# Patient Record
Sex: Male | Born: 1970 | Race: White | Hispanic: No | Marital: Married | Smoking: Former smoker
Health system: Southern US, Community
[De-identification: ages and names within clinical notes are randomized; demographics above are authoritative.]

## PROBLEM LIST (undated history)

## (undated) DIAGNOSIS — R109 Unspecified abdominal pain: Secondary | ICD-10-CM

## (undated) DIAGNOSIS — E785 Hyperlipidemia, unspecified: Secondary | ICD-10-CM

## (undated) HISTORY — DX: Hyperlipidemia, unspecified: E78.5

## (undated) HISTORY — PX: CORONARY ANGIOPLASTY: SHX604

---

## 2001-05-19 ENCOUNTER — Emergency Department (HOSPITAL_COMMUNITY): Admission: EM | Admit: 2001-05-19 | Discharge: 2001-05-19 | Payer: Self-pay | Admitting: Emergency Medicine

## 2001-05-19 ENCOUNTER — Encounter: Payer: Self-pay | Admitting: Emergency Medicine

## 2001-09-24 ENCOUNTER — Encounter: Payer: Self-pay | Admitting: Emergency Medicine

## 2001-09-24 ENCOUNTER — Inpatient Hospital Stay (HOSPITAL_COMMUNITY): Admission: EM | Admit: 2001-09-24 | Discharge: 2001-09-26 | Payer: Self-pay | Admitting: Emergency Medicine

## 2001-09-27 ENCOUNTER — Emergency Department (HOSPITAL_COMMUNITY): Admission: EM | Admit: 2001-09-27 | Discharge: 2001-09-27 | Payer: Self-pay | Admitting: *Deleted

## 2003-07-01 ENCOUNTER — Ambulatory Visit (HOSPITAL_COMMUNITY): Admission: RE | Admit: 2003-07-01 | Discharge: 2003-07-01 | Payer: Self-pay | Admitting: Family Medicine

## 2003-09-17 ENCOUNTER — Ambulatory Visit (HOSPITAL_COMMUNITY): Admission: RE | Admit: 2003-09-17 | Discharge: 2003-09-17 | Payer: Self-pay | Admitting: Family Medicine

## 2004-12-03 ENCOUNTER — Emergency Department (HOSPITAL_COMMUNITY): Admission: EM | Admit: 2004-12-03 | Discharge: 2004-12-04 | Payer: Self-pay | Admitting: Emergency Medicine

## 2005-12-27 ENCOUNTER — Emergency Department (HOSPITAL_COMMUNITY): Admission: EM | Admit: 2005-12-27 | Discharge: 2005-12-27 | Payer: Self-pay | Admitting: Emergency Medicine

## 2007-06-02 ENCOUNTER — Emergency Department (HOSPITAL_COMMUNITY): Admission: EM | Admit: 2007-06-02 | Discharge: 2007-06-02 | Payer: Self-pay | Admitting: Emergency Medicine

## 2007-07-16 ENCOUNTER — Inpatient Hospital Stay (HOSPITAL_BASED_OUTPATIENT_CLINIC_OR_DEPARTMENT_OTHER): Admission: RE | Admit: 2007-07-16 | Discharge: 2007-07-16 | Payer: Self-pay | Admitting: Cardiology

## 2008-12-08 ENCOUNTER — Ambulatory Visit (HOSPITAL_COMMUNITY): Admission: RE | Admit: 2008-12-08 | Discharge: 2008-12-08 | Payer: Self-pay | Admitting: Psychiatry

## 2010-06-19 NOTE — Cardiovascular Report (Signed)
Charles Villanueva, Charles Villanueva                 ACCOUNT NO.:  [id number]   MEDICAL RECORD NO.:  [id number]          PATIENT TYPE:  OIB   LOCATION:  1962                         FACILITY:  [facility]   PHYSICIAN:  Armanda Magic, M.D.     DATE OF BIRTH:  May 05, 1970   DATE OF PROCEDURE:  07/15/2007  DATE OF DISCHARGE:                            CARDIAC CATHETERIZATION   REFERRING PHYSICIAN:  [name], MD   PROCEDURE:  1. Left heart catheterization.  2. Coronary angiography.  3. Left ventriculography.   OPERATOR:  Armanda Magic, MD   INDICATIONS:  Abnormal exercise treadmill test and dyslipidemia as well  as some occasional shortness of breath.   This is a very pleasant 40 year old male who presented to the emergency  room recently with some shortness of breath.  His cardiac workup was  negative, but he was found to have a triglycerides elevated at 888 and  he has been on Lovaza.  Because of his hyperlipidemia and some shortness  of breath, he was referred for cardiac evaluation.  The patient had an  exercise treadmill test, which showed EKG changes early on and now  scheduled for cardiac catheterization.   The patient was brought to the cardiac catheterization laboratory in a  fasting nonsedated state.  An informed consent was obtained.  The  patient was connected to continuous heart rate, pulse oximetry  monitoring, and blood pressure monitoring.  The right groin was prepped  and draped in sterile fashion.  A 1% Xylocaine was used for local  anesthesia.  Using modified Seldinger technique, a 4-French sheath was  placed in the right femoral artery.  Under fluoroscopic guidance, a 4-  [place] JL4 catheter was placed in left coronary artery.  Multiple cine  films were taken at 30-degree RAO, 40-degree LAO views.  His catheter  was then exchanged out over a guidewire for a 4-French 3-D RCA catheter  which was placed in the right coronary artery.  Multiple cine films  taken at 30-degree RAO and  40-degree LAO views.  The catheter was then  exchanged out over a guidewire for a 6 through a 4-French angled pigtail  catheter, which was placed under fluoroscopic guidance in the left  ventricular cavity.  A left ventriculography was performed in the 30-  degree RAO view using total of 30 mL of contrast at 15 mL per second.  Catheter was then pulled back across the aortic valve with no  significant gradient noted.  At the end of procedure, all catheters and  sheaths were removed.  Manual compression was performed till adequate  hemostasis was obtained.  The patient transferred back to room in stable  condition.   RESULTS:  The left main coronary artery is widely patent and trifurcates  into the left anterior descending artery, ramus branch, and left  circumflex artery.   The left anterior descending artery is widely patent throughout its  course.  The apex giving rise to a large first diagonal branch, which  has a proximal 60-75% narrowing.  It bifurcates into 2 daughter  branches.  The ramus branch is widely patent.  The left circumflex is widely patent throughout its course to the apex.  It gives rise to 2 obtuse marginal branches, but are both widely patent  and terminates in a posterior descending artery, which is widely patent  in his left dominant system.   The right coronary artery is widely patent and gives off an acute RV  marginal branch and then terminates in the posterior lateral branch, all  of which are widely patent.   The left ventriculography shows normal LV function with EF of 60%, LV  pressure 123/30 mmHg, aortic pressure 123/70 mmHg, and LVEDP 15 mmHg.   ASSESSMENT:  1. 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  2. Normal left ventricular function.  3. Hypertriglyceridemia.   PLAN:  Discharge to home and bedrest complete.  Start aspirin 325 mg a  day.  Aggressive lipid management and will follow up in [facility].  He will follow up  with my nurse practitioner in 2 weeks for groin check  and I will review the films with Dr. Eldridge Dace in regards to medical  therapy versus PCI of the diagonal.      Armanda Magic, M.D.  Electronically Signed     TT/MEDQ  D:  07/16/2007  T:  07/16/2007  Job:  [id number]   cc:   [name], M.D.

## 2010-06-22 NOTE — H&P (Signed)
   NAME:  Villanueva, Charles A                           ACCOUNT NO.:  [id number]   MEDICAL RECORD NO.:  [id number]                   PATIENT TYPE:  INP   LOCATION:  5725                                 FACILITY:  [facility]   PHYSICIAN:  Verl Dicker, M.D.          DATE OF BIRTH:  1970/11/23   DATE OF ADMISSION:  09/24/2001  DATE OF DISCHARGE:                                HISTORY & PHYSICAL   HISTORY OF PRESENT ILLNESS:  A 40 year old male with a 2 mm left UVJ stone.  The patient has intractable pain.   Positive family history for renal stones.  The patient's father, Rollin Kotowski, has been a patient of Dr. Aldean Ast for many years.   In April of 2003, he was seen at [facility] emergency room where  CAT scan showed nonobstructive 2 mm stone within the left kidney and a 2 mm  stone at the left UVJ.  The patient subsequently passed the UVJ stone on his  own. Follow-up in the office showed that microscopic hematuria had resolved  and hypercalcuria profile showed no abnormality.   On September 24, 2001, at 6 a.m. he had intense left CVA tenderness with  nausea, but no vomiting or fever.  Repeat CT scan of the abdomen, spiral CT  without contrast, showed the previous left renal calculus had disappeared  and was now at the left UVJ, approximately 2 mm.  The patient has had no  relief despite IM Toradol and p.r.n. Dilaudid.  He is admitted for PCA  Dilaudid with q.8h. Toradol.   MEDICATIONS:  Ibuprofen.   ALLERGIES:  PENICILLIN, ASPIRIN.   Tobacco; half a pack per day over 12 years.  EtOH; denies.   PAST MEDICAL HISTORY:  Significant for spontaneous passage of ureteral  calculus in April of 2003.   PHYSICAL EXAMINATION:  GENERAL: A 40 year old white male with intractable  left CVA tenderness.  VITAL SIGNS:  Temperature 97.9, blood pressure 156/88, pulse 75,  respirations 22.  HEENT:  Negative adenopathy and negative bruit.  LUNGS:  Clear to P&A.  HEART: Regular rate and  rhythm without murmur or gallop.  ABDOMEN:  Positive bowel sounds, soft.  GENITOURINARY:  Intense left CVA tenderness with radiation to the left lower  quadrant. Bilaterally descended testes.  Penis without lesions.  No obvious  evidence of hernia.  Nontender, non-nodular prostate.  NEUROLOGY:  Grossly intact.   PLAN:  IV fluids, IV Toradol, and PCA Dilaudid.  Will follow clinical  examination closely.                                              Verl Dicker, M.D.   [facility]/MEDQ  D:  09/24/2001  T:  09/26/2001  Job:  [id number]

## 2010-10-30 LAB — DIFFERENTIAL
Basophils Absolute: 0.1
Eosinophils Relative: 1
Lymphocytes Relative: 20
Monocytes Relative: 6
Neutro Abs: 7.4
Neutrophils Relative %: 73

## 2010-10-30 LAB — POCT CARDIAC MARKERS
CKMB, poc: <1 — ABNORMAL LOW
CKMB, poc: <1 — ABNORMAL LOW
Myoglobin, poc: 94.3
Operator id: 146091
Operator id: 234501
Troponin i, poc: <0.05
Troponin i, poc: <0.05

## 2010-10-30 LAB — CBC
HCT: 45.8
MCHC: 34.3
Platelets: 141 — ABNORMAL LOW
RBC: 5.23

## 2010-10-30 LAB — POCT I-STAT, CHEM 8
Creatinine, Ser: 1
Hemoglobin: 16
Sodium: 140

## 2012-06-11 ENCOUNTER — Other Ambulatory Visit: Payer: Self-pay | Admitting: Physician Assistant

## 2012-06-11 DIAGNOSIS — R1013 Epigastric pain: Secondary | ICD-10-CM

## 2012-06-16 ENCOUNTER — Ambulatory Visit
Admission: RE | Admit: 2012-06-16 | Discharge: 2012-06-16 | Disposition: A | Discharge status: Home / Self Care | Payer: BC Managed Care – PPO | Source: Ambulatory Visit | Attending: Physician Assistant | Admitting: Physician Assistant

## 2012-06-16 DIAGNOSIS — R1013 Epigastric pain: Secondary | ICD-10-CM

## 2012-06-19 ENCOUNTER — Other Ambulatory Visit (HOSPITAL_COMMUNITY): Payer: Self-pay | Admitting: Gastroenterology

## 2012-06-19 DIAGNOSIS — R109 Unspecified abdominal pain: Secondary | ICD-10-CM

## 2012-07-14 ENCOUNTER — Encounter (HOSPITAL_COMMUNITY)
Admission: RE | Admit: 2012-07-14 | Discharge: 2012-07-14 | Disposition: A | Discharge status: Home / Self Care | Payer: BC Managed Care – PPO | Source: Ambulatory Visit | Attending: Gastroenterology | Admitting: Gastroenterology

## 2012-07-14 ENCOUNTER — Encounter (HOSPITAL_COMMUNITY): Payer: Self-pay

## 2012-07-14 DIAGNOSIS — R1011 Right upper quadrant pain: Secondary | ICD-10-CM | POA: Insufficient documentation

## 2012-07-14 DIAGNOSIS — R109 Unspecified abdominal pain: Secondary | ICD-10-CM

## 2012-07-14 HISTORY — DX: Unspecified abdominal pain: R10.9

## 2012-07-14 MED ORDER — TECHNETIUM TC 99M MEBROFENIN IV KIT
5.3000 | PACK | Freq: Once | INTRAVENOUS | Status: AC | PRN
  Administered 2012-07-14: 5.3 via INTRAVENOUS

## 2013-03-13 ENCOUNTER — Encounter: Payer: Self-pay | Admitting: *Deleted

## 2013-03-13 ENCOUNTER — Other Ambulatory Visit: Payer: Self-pay | Admitting: *Deleted

## 2013-03-13 DIAGNOSIS — I25810 Atherosclerosis of coronary artery bypass graft(s) without angina pectoris: Secondary | ICD-10-CM

## 2013-03-13 DIAGNOSIS — E7800 Pure hypercholesterolemia, unspecified: Secondary | ICD-10-CM

## 2013-03-13 HISTORY — DX: Pure hypercholesterolemia, unspecified: E78.00

## 2013-03-13 HISTORY — DX: Atherosclerosis of coronary artery bypass graft(s) without angina pectoris: I25.810

## 2014-01-12 IMAGING — NM NM HEPATO W/GB/PHARM/[PERSON_NAME]
2 series · 12 of 12 positions shown · non-contrast
Comparison: none

CLINICAL DATA: Right upper abdominal pain.  Negative gallbladder
ultrasound.

[Series 1: gb hepatobiliary scan · 4.75mm/px · 6 of 60 frames shown (1 of 2)]
[frame 6/60]
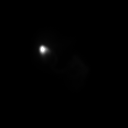
[frame 16/60]
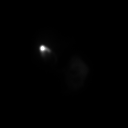
[frame 26/60]
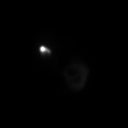
[frame 36/60]
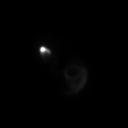
[frame 46/60]
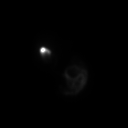
[frame 56/60]
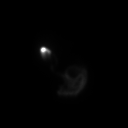

[Series 1: gb hepatobiliary scan · 4.75mm/px · 6 of 60 frames shown (2 of 2)]
[frame 6/60]
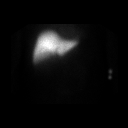
[frame 16/60]
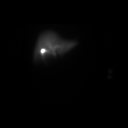
[frame 26/60]
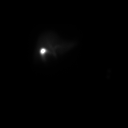
[frame 36/60]
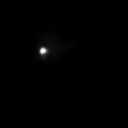
[frame 46/60]
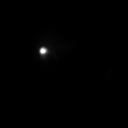
[frame 56/60]
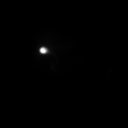

[12 of 12 positions shown; findings below may reference images not displayed]

HEPATOBILIARY SCINTIGRAPHY WITH EJECTION FRACTION

Anterior imaging afterH.4m8i TcQQP Choletec IV. There is prompt
clearance of the radiopharmaceutical from the blood pool. Timely
visualization of activity in central bile ducts, small bowel, and
gallbladder.
After 1 hour,the patient ingested 8 ounces Ensure orally, and
imaging continued. No symptoms reported after ingestion. The
calculated gallbladder ejection fraction over 30 minutes is 41.8%
[(normal >30% at  thirty minutes (Ziessman et al., 3883 J. Nuclear
Med).]

IMPRESSION
1. Patency of cystic and common bile ducts.
2. Normal gallbladder ejection fraction.

## 2014-07-27 ENCOUNTER — Encounter (HOSPITAL_COMMUNITY): Payer: Self-pay | Admitting: *Deleted

## 2014-07-27 ENCOUNTER — Emergency Department (HOSPITAL_COMMUNITY)
Admission: EM | Admit: 2014-07-27 | Discharge: 2014-07-27 | Disposition: A | Discharge status: Home / Self Care | Payer: BLUE CROSS/BLUE SHIELD | Attending: Emergency Medicine | Admitting: Emergency Medicine

## 2014-07-27 DIAGNOSIS — M79662 Pain in left lower leg: Secondary | ICD-10-CM | POA: Diagnosis not present

## 2014-07-27 DIAGNOSIS — M79651 Pain in right thigh: Secondary | ICD-10-CM | POA: Diagnosis not present

## 2014-07-27 DIAGNOSIS — Z8639 Personal history of other endocrine, nutritional and metabolic disease: Secondary | ICD-10-CM | POA: Insufficient documentation

## 2014-07-27 DIAGNOSIS — M79661 Pain in right lower leg: Secondary | ICD-10-CM | POA: Insufficient documentation

## 2014-07-27 DIAGNOSIS — Z87891 Personal history of nicotine dependence: Secondary | ICD-10-CM | POA: Diagnosis not present

## 2014-07-27 DIAGNOSIS — M79652 Pain in left thigh: Secondary | ICD-10-CM | POA: Diagnosis not present

## 2014-07-27 DIAGNOSIS — G8929 Other chronic pain: Secondary | ICD-10-CM | POA: Diagnosis not present

## 2014-07-27 DIAGNOSIS — R51 Headache: Secondary | ICD-10-CM | POA: Diagnosis not present

## 2014-07-27 DIAGNOSIS — M79606 Pain in leg, unspecified: Secondary | ICD-10-CM

## 2014-07-27 DIAGNOSIS — F419 Anxiety disorder, unspecified: Secondary | ICD-10-CM | POA: Insufficient documentation

## 2014-07-27 DIAGNOSIS — Z880 Allergy status to penicillin: Secondary | ICD-10-CM | POA: Insufficient documentation

## 2014-07-27 DIAGNOSIS — M629 Disorder of muscle, unspecified: Secondary | ICD-10-CM

## 2014-07-27 DIAGNOSIS — M62838 Other muscle spasm: Secondary | ICD-10-CM | POA: Diagnosis present

## 2014-07-27 LAB — CBC WITH DIFFERENTIAL/PLATELET
Basophils Absolute: 0 10*3/uL (ref 0.0–0.1)
Basophils Relative: 0 % (ref 0–1)
EOS ABS: 0 10*3/uL (ref 0.0–0.7)
EOS PCT: 0 % (ref 0–5)
HCT: 44.5 % (ref 39.0–52.0)
Hemoglobin: 16 g/dL (ref 13.0–17.0)
LYMPHS ABS: 1.3 10*3/uL (ref 0.7–4.0)
Lymphocytes Relative: 14 % (ref 12–46)
MCH: 29.9 pg (ref 26.0–34.0)
MCHC: 36 g/dL (ref 30.0–36.0)
MCV: 83.2 fL (ref 78.0–100.0)
MONO ABS: 0.7 10*3/uL (ref 0.1–1.0)
Monocytes Relative: 7 % (ref 3–12)
NEUTROS PCT: 79 % — AB (ref 43–77)
Neutro Abs: 7.8 10*3/uL — ABNORMAL HIGH (ref 1.7–7.7)
PLATELETS: 128 10*3/uL — AB (ref 150–400)
RBC: 5.35 MIL/uL (ref 4.22–5.81)
RDW: 12.8 % (ref 11.5–15.5)
WBC: 9.8 10*3/uL (ref 4.0–10.5)

## 2014-07-27 LAB — COMPREHENSIVE METABOLIC PANEL
ALT: 24 U/L (ref 17–63)
ANION GAP: 9 (ref 5–15)
AST: 26 U/L (ref 15–41)
Albumin: 4.1 g/dL (ref 3.5–5.0)
Alkaline Phosphatase: 57 U/L (ref 38–126)
BILIRUBIN TOTAL: 0.6 mg/dL (ref 0.3–1.2)
BUN: 14 mg/dL (ref 6–20)
CALCIUM: 8.8 mg/dL — AB (ref 8.9–10.3)
CHLORIDE: 100 mmol/L — AB (ref 101–111)
CO2: 25 mmol/L (ref 22–32)
CREATININE: 1.12 mg/dL (ref 0.61–1.24)
Glucose, Bld: 93 mg/dL (ref 65–99)
Potassium: 4.1 mmol/L (ref 3.5–5.1)
Sodium: 134 mmol/L — ABNORMAL LOW (ref 135–145)
Total Protein: 7 g/dL (ref 6.5–8.1)

## 2014-07-27 MED ORDER — CYCLOBENZAPRINE HCL 10 MG PO TABS: 10.0000 mg | ORAL_TABLET | Freq: Three times a day (TID) | ORAL | Status: DC | PRN

## 2014-07-27 NOTE — ED Notes (Signed)
Pt c/o bilateral muscle spasms in lower legs for a week. Pt denies pain with the muscle spasms until today. Pt states pain shot up his groin and now also c/o HA. Pt states he has severe anxiety. Pt reports eating and drinking ok. 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 Pt denies redness or swelling in legs.

## 2014-07-27 NOTE — ED Provider Notes (Signed)
CSN: [id number]     Arrival date & time 07/27/14  1953 History   First MD Initiated Contact with Patient 07/27/14 2042     Chief Complaint  Patient presents with  . Spasms     (Consider location/radiation/quality/duration/timing/severity/associated sxs/prior Treatment) HPI Comments: Charles Villanueva is a 44 y.o. male with a PMHx of anxiety, chronic abd pain, and HLD, who presents to the ED with complaints of bilateral calf and hamstring spasms 10 days. He reports that initially they were not painful, but they've become more painful. Describes the pain is 9/10 dull and aching and occasionally burning, intermittent only with spasms, located in both hamstrings and calves, nonradiating, worse with prolonged sitting and flexing of his muscles, and with no treatments tried prior to arrival. He reports a sedentary job which aggravates his symptoms. Additionally reports he is developing a migraine which started gradually approximately one hour ago and is the same as his prior headaches. He denies any erythema to his legs, warmth, leg swelling, recent trauma or injury, recent travel or prolonged immobilization, recent surgery, fevers, chills, chest pain, shortness breath, cough, hemoptysis, wheezing, leg swelling, abdominal pain, nausea, vomiting, diarrhea, constipation, melanotic, hematochezia, dysuria, hematuria, numbness, tingling, weakness, lightheadedness, dizziness, or vision changes.   Patient is a 44 y.o. male presenting with musculoskeletal pain. The history is provided by the patient. No language interpreter was used.  Muscle Pain This is a new problem. The current episode started 1 to 4 weeks ago. The problem occurs intermittently. The problem has been unchanged. Associated symptoms include headaches and myalgias (b/l hamstrings and calves). Pertinent negatives include no abdominal pain, arthralgias, chest pain, chills, coughing, fever, joint swelling, nausea, numbness, urinary symptoms, visual change,  vomiting or weakness. Exacerbated by: sitting. He has tried nothing for the symptoms. The treatment provided no relief.    Past Medical History  Diagnosis Date  . Abdominal pain   . Dyslipidemia    Past Surgical History  Procedure Laterality Date  . Coronary angioplasty     Family History  Problem Relation Age of Onset  . Hypertension Mother   . Hypertension Father    History  Substance Use Topics  . Smoking status: Former Smoker    Quit date: 03/13/2010  . Smokeless tobacco: Not on file  . Alcohol Use: No    Review of Systems  Constitutional: Negative for fever and chills.  Eyes: Negative for visual disturbance.  Respiratory: Negative for cough, shortness of breath and wheezing.   Cardiovascular: Negative for chest pain and leg swelling.  Gastrointestinal: Negative for nausea, vomiting, abdominal pain, diarrhea, constipation and blood in stool.  Genitourinary: Negative for dysuria and hematuria.  Musculoskeletal: Positive for myalgias (b/l hamstrings and calves). Negative for joint swelling and arthralgias.  Skin: Negative for color change.  Allergic/Immunologic: Negative for immunocompromised state.  Neurological: Positive for headaches. Negative for dizziness, weakness, light-headedness and numbness.  Psychiatric/Behavioral: Negative for confusion.   10 Systems reviewed and are negative for acute change except as noted in the HPI.    Allergies  Gluten meal and Penicillins  Home Medications   Prior to Admission medications   Medication Sig Start Date End Date Taking? Authorizing Provider  ALPRAZolam Prudy Feeler) 0.5 MG tablet Take 0.5 mg by mouth 2 (two) times daily as needed for anxiety.   Yes Historical Provider, MD  DM-Phenylephrine-Acetaminophen (VICKS DAYQUIL MULTI-SYMPTOM) 10-5-325 MG CAPS Take 1 tablet by mouth daily as needed (pain).   Yes Historical Provider, MD   BP 140/91 mmHg  Pulse  87  Temp(Src) 98.9 F (37.2 C) (Oral)  Resp 16  SpO2 98% Physical  Exam  Constitutional: He is oriented to person, place, and time. Vital signs are normal. He appears well-developed and well-nourished.  Non-toxic appearance. No distress.  Afebrile, nontoxic, NAD  HENT:  Head: Normocephalic and atraumatic.  Mouth/Throat: Oropharynx is clear and moist and mucous membranes are normal.  Eyes: Conjunctivae and EOM are normal. Pupils are equal, round, and reactive to light. Right eye exhibits no discharge. Left eye exhibits no discharge.  PERRL, EOMI, no nystagmus, no visual field deficits   Neck: Normal range of motion. Neck supple.  Cardiovascular: Normal rate, regular rhythm, normal heart sounds and intact distal pulses.  Exam reveals no gallop and no friction rub.   No murmur heard. Pulmonary/Chest: Effort normal and breath sounds normal. No respiratory distress. He has no decreased breath sounds. He has no wheezes. He has no rhonchi. He has no rales.  Abdominal: Soft. Normal appearance and bowel sounds are normal. He exhibits no distension. There is no tenderness. There is no rigidity, no rebound, no guarding, no CVA tenderness, no tenderness at McBurney's point and negative Murphy's sign.  Musculoskeletal: Normal range of motion.       Right upper leg: He exhibits tenderness. He exhibits no bony tenderness and no edema.       Left upper leg: He exhibits tenderness. He exhibits no bony tenderness and no edema.       Right lower leg: He exhibits tenderness. He exhibits no bony tenderness and no swelling.       Left lower leg: He exhibits tenderness. He exhibits no bony tenderness and no swelling.  B/l hips and knees nonTTP with FROM intact, no swelling or erythema, no warmth. B/L posterior thigh/hamstrings and calves with diffuse tenderness, some tightness/spasm palpated in hamstrings bilaterally, no bony TTP. No pedal edema, neg Homan's bilaterally. MAE x4. Strength and sensation grossly intact. Distal pulses intact.   Neurological: He is alert and oriented to  person, place, and time. He has normal strength. No cranial nerve deficit or sensory deficit. Coordination and gait normal. GCS eye subscore is 4. GCS verbal subscore is 5. GCS motor subscore is 6.  CN 2-12 grossly intact A&O x4 GCS 15 Sensation and strength intact Gait nonataxic Coordination WNL  Skin: Skin is warm, dry and intact. No rash noted.  Psychiatric: He has a normal mood and affect.  Nursing note and vitals reviewed.   ED Course  Procedures (including critical care time) Labs Review Labs Reviewed  CBC WITH DIFFERENTIAL/PLATELET - Abnormal; Notable for the following:    Platelets 128 (*)    Neutrophils Relative % 79 (*)    Neutro Abs 7.8 (*)    All other components within normal limits  COMPREHENSIVE METABOLIC PANEL - Abnormal; Notable for the following:    Sodium 134 (*)    Chloride 100 (*)    Calcium 8.8 (*)    All other components within normal limits    Imaging Review No results found.   EKG Interpretation None      MDM   Final diagnoses:  Pain of lower extremity, unspecified laterality  Muscle spasms of both lower extremities  Hamstring tightness of both lower extremities    44 y.o. male here with b/l leg spasms intermittently. Also states he has a HA which is his typical headache and without red flag s/sx, nonfocal neuro exam. On exam, hamstrings very tight with diffuse muscular tenderness in both legs, no  swelling or erythema, no warmth, no homan's sign. Agreed that checking labs is reasonable, but doubt need for imaging, this is likely due to sedentary lifestyle and some tightening in hamstrings. Discussed importance of stretching and staying hydrated. Pt declined meds here. Will await labs.   9:39 PM Labs unremarkable. Discussed stretching, heat therapy, tylenol/motrin, and use of flexeril as directed/needed for spasm. Will have him f/up with PCP in 1wk. I explained the diagnosis and have given explicit precautions to return to the ER including for  any other new or worsening symptoms. The patient understands and accepts the medical plan as it's been dictated and I have answered their questions. Discharge instructions concerning home care and prescriptions have been given. The patient is STABLE and is discharged to home in good condition.  BP 140/91 mmHg  Pulse 87  Temp(Src) 98.9 F (37.2 C) (Oral)  Resp 16  SpO2 98%  Meds ordered this encounter  Medications  . cyclobenzaprine (FLEXERIL) 10 MG tablet    Sig: Take 1 tablet (10 mg total) by mouth 3 (three) times daily as needed for muscle spasms.    Dispense:  15 tablet    Refill:  0    Order Specific Question:  Supervising Provider    Answer:  Eber Hong [[id number]]     Khush Pasion Camprubi-Soms, PA-C 07/27/14 2140  Vanetta Mulders, MD 07/31/14 [phone]

## 2014-07-27 NOTE — Discharge Instructions (Signed)
Use heat to the areas of pain, 20 minutes at a time every hour. Stay well hydrated. Use tylenol or motrin as needed for pain. Stretch daily, and avoid prolonged sitting. Use flexeril as directed as needed for spasms, but don't drive while taking this medication. Follow up with your regular doctor in 1 week for recheck of symptoms. Return to the ER for changes or worsening symptoms.   Muscle Cramps and Spasms Muscle cramps and spasms are when muscles tighten by themselves. They usually get better within minutes. Muscle cramps are painful. They are usually stronger and last longer than muscle spasms. Muscle spasms may or may not be painful. They can last a few seconds or much longer. HOME CARE  Drink enough fluid to keep your pee (urine) clear or pale yellow.  Massage, stretch, and relax the muscle.  Use a warm towel, heating pad, or warm shower water on tight muscles.  Place ice on the muscle if it is tender or in pain.  Put ice in a plastic bag.  Place a towel between your skin and the bag.  Leave the ice on for 15-20 minutes, 03-04 times a day.  Only take medicine as told by your doctor. GET HELP RIGHT AWAY IF:  Your cramps or spasms get worse, happen more often, or do not get better with time. MAKE SURE YOU:  Understand these instructions.  Will watch your condition.  Will get help right away if you are not doing well or get worse. Document Released: 01/04/2008 Document Revised: 05/18/2012 Document Reviewed: 01/08/2012 [facility] Patient Information 2015 [place], [place]. This information is not intended to replace advice given to you by your health care provider. Make sure you discuss any questions you have with your health care provider.  Heat Therapy Heat therapy can help ease sore, stiff, injured, and tight muscles and joints. Heat relaxes your muscles, which may help ease your pain.  RISKS AND COMPLICATIONS If you have any of the following conditions, do not use heat therapy  unless your health care provider has approved:  Poor circulation.  Healing wounds or scarred skin in the area being treated.  Diabetes, heart disease, or high blood pressure.  Not being able to feel (numbness) the area being treated.  Unusual swelling of the area being treated.  Active infections.  Blood clots.  Cancer.  Inability to communicate pain. This may include young children and people who have problems with their brain function (dementia).  Pregnancy. Heat therapy should only be used on old, pre-existing, or long-lasting (chronic) injuries. Do not use heat therapy on new injuries unless directed by your health care provider. HOW TO USE HEAT THERAPY There are several different kinds of heat therapy, including:  Moist heat pack.  Warm water bath.  Hot water bottle.  Electric heating pad.  Heated gel pack.  Heated wrap.  Electric heating pad. Use the heat therapy method suggested by your health care provider. Follow your health care provider's instructions on when and how to use heat therapy. GENERAL HEAT THERAPY RECOMMENDATIONS  Do not sleep while using heat therapy. Only use heat therapy while you are awake.  Your skin may turn pink while using heat therapy. Do not use heat therapy if your skin turns red.  Do not use heat therapy if you have new pain.  High heat or long exposure to heat can cause burns. Be careful when using heat therapy to avoid burning your skin.  Do not use heat therapy on areas of your  skin that are already irritated, such as with a rash or sunburn. SEEK MEDICAL CARE IF:  You have blisters, redness, swelling, or numbness.  You have new pain.  Your pain is worse. MAKE SURE YOU:  Understand these instructions.  Will watch your condition.  Will get help right away if you are not doing well or get worse. Document Released: 04/15/2011 Document Revised: 06/07/2013 Document Reviewed: 03/16/2013 [facility] Patient Information 2015  [place], [place]. This information is not intended to replace advice given to you by your health care provider. Make sure you discuss any questions you have with your health care provider.

## 2016-07-29 ENCOUNTER — Ambulatory Visit: Payer: BLUE CROSS/BLUE SHIELD | Admitting: Podiatry

## 2018-11-17 ENCOUNTER — Emergency Department (HOSPITAL_COMMUNITY)
Admission: EM | Admit: 2018-11-17 | Discharge: 2018-11-17 | Disposition: A | Discharge status: Home / Self Care | Payer: Self-pay | Attending: Emergency Medicine | Admitting: Emergency Medicine

## 2018-11-17 ENCOUNTER — Emergency Department (HOSPITAL_COMMUNITY): Payer: Self-pay

## 2018-11-17 ENCOUNTER — Other Ambulatory Visit: Payer: Self-pay

## 2018-11-17 DIAGNOSIS — Z87891 Personal history of nicotine dependence: Secondary | ICD-10-CM | POA: Insufficient documentation

## 2018-11-17 DIAGNOSIS — Z951 Presence of aortocoronary bypass graft: Secondary | ICD-10-CM | POA: Insufficient documentation

## 2018-11-17 DIAGNOSIS — R109 Unspecified abdominal pain: Secondary | ICD-10-CM | POA: Insufficient documentation

## 2018-11-17 DIAGNOSIS — I2510 Atherosclerotic heart disease of native coronary artery without angina pectoris: Secondary | ICD-10-CM | POA: Insufficient documentation

## 2018-11-17 DIAGNOSIS — R0789 Other chest pain: Secondary | ICD-10-CM | POA: Insufficient documentation

## 2018-11-17 DIAGNOSIS — R079 Chest pain, unspecified: Secondary | ICD-10-CM

## 2018-11-17 DIAGNOSIS — R42 Dizziness and giddiness: Secondary | ICD-10-CM | POA: Insufficient documentation

## 2018-11-17 DIAGNOSIS — Z20828 Contact with and (suspected) exposure to other viral communicable diseases: Secondary | ICD-10-CM | POA: Insufficient documentation

## 2018-11-17 LAB — CBC
HCT: 49.1 % (ref 39.0–52.0)
Hemoglobin: 17.1 g/dL — ABNORMAL HIGH (ref 13.0–17.0)
MCH: 29.3 pg (ref 26.0–34.0)
MCHC: 34.8 g/dL (ref 30.0–36.0)
MCV: 84.2 fL (ref 80.0–100.0)
Platelets: 134 10*3/uL — ABNORMAL LOW (ref 150–400)
RBC: 5.83 MIL/uL — ABNORMAL HIGH (ref 4.22–5.81)
RDW: 12.6 % (ref 11.5–15.5)
WBC: 6.3 10*3/uL (ref 4.0–10.5)
nRBC: 0 % (ref 0.0–0.2)

## 2018-11-17 LAB — URINALYSIS, ROUTINE W REFLEX MICROSCOPIC
Bilirubin Urine: NEGATIVE
Glucose, UA: NEGATIVE mg/dL
Hgb urine dipstick: NEGATIVE
Ketones, ur: NEGATIVE mg/dL
Leukocytes,Ua: NEGATIVE
Nitrite: NEGATIVE
Protein, ur: NEGATIVE mg/dL
Specific Gravity, Urine: 1.006 (ref 1.005–1.030)
pH: 7 (ref 5.0–8.0)

## 2018-11-17 LAB — COMPREHENSIVE METABOLIC PANEL
ALT: 27 U/L (ref 0–44)
AST: 21 U/L (ref 15–41)
Albumin: 4 g/dL (ref 3.5–5.0)
Alkaline Phosphatase: 48 U/L (ref 38–126)
Anion gap: 11 (ref 5–15)
BUN: 12 mg/dL (ref 6–20)
CO2: 21 mmol/L — ABNORMAL LOW (ref 22–32)
Calcium: 9.4 mg/dL (ref 8.9–10.3)
Chloride: 104 mmol/L (ref 98–111)
Creatinine, Ser: 1.07 mg/dL (ref 0.61–1.24)
GFR calc Af Amer: >60 mL/min (ref >60)
GFR calc non Af Amer: >60 mL/min (ref >60)
Glucose, Bld: 106 mg/dL — ABNORMAL HIGH (ref 70–99)
Potassium: 4 mmol/L (ref 3.5–5.1)
Sodium: 136 mmol/L (ref 135–145)
Total Bilirubin: 1.1 mg/dL (ref 0.3–1.2)
Total Protein: 7 g/dL (ref 6.5–8.1)

## 2018-11-17 LAB — LIPASE, BLOOD: Lipase: 44 U/L (ref 11–51)

## 2018-11-17 LAB — TROPONIN I (HIGH SENSITIVITY)
Troponin I (High Sensitivity): 4 ng/L (ref <18)
Troponin I (High Sensitivity): 4 ng/L (ref <18)

## 2018-11-17 LAB — SARS CORONAVIRUS 2 (TAT 6-24 HRS): SARS Coronavirus 2: NEGATIVE

## 2018-11-17 MED ORDER — NITROGLYCERIN 0.4 MG SL SUBL: 0.4000 mg | SUBLINGUAL_TABLET | SUBLINGUAL | Status: DC | PRN

## 2018-11-17 MED ORDER — ASPIRIN 81 MG PO CHEW
324.0000 mg | CHEWABLE_TABLET | Freq: Once | ORAL | Status: AC
  Administered 2018-11-17: 324 mg via ORAL
  Filled 2018-11-17: qty 4

## 2018-11-17 MED ORDER — SODIUM CHLORIDE 0.9% FLUSH
3.0000 mL | Freq: Once | INTRAVENOUS | Status: AC
  Administered 2018-11-17: 10:00:00 3 mL via INTRAVENOUS

## 2018-11-17 NOTE — ED Notes (Signed)
Gave patient a urinal  

## 2018-11-17 NOTE — ED Provider Notes (Signed)
[facility] Provider Note   CSN: [id number] Arrival date & time: 11/17/18  [id number]     History   Chief Complaint Chief Complaint  Patient presents with  . Abdominal Pain  . Palpitations    HPI Charles Villanueva is a 48 y.o. male.     HPI  48 year old male presents today complaining of onset of substernal chest pain which was pressure in nature at 7:45 AM.  He had associated lightheadedness.  He denies any dyspnea, radiation, or diaphoresis.  He states he has had some of this in the past and has had some anxiety.  He feels that this is different.  The pain is been pressure in nature.  It was 7 out of 10 initially and is now 3 out of 10.  He has had no known interventions.  He did not take anything.  He states he was transported via EMS but did not have any oral medications.  IV was started in route.  He states that he felt like everything turned upside down but did not have nausea or vomiting.  He denies any lateralized weakness.  He denies any history of DVT, PE, COVID exposure, nasal congestion, cough, fever, or chills.  He did not have breakfast this morning as this preceded his normal breakfast time.  He was at work as a Electrical engineersecurity guard and was standing but not exerting himself when it began. He states that he was seen by [facility] several years ago and had aCardiac catheterization.  Cardiac catheterization t he states he was told that it showed some minor abnormalities.  I am reviewing this in [facility] but I am unable to see the results of the catheterization done in 2009  Past Medical History:  Diagnosis Date  . Abdominal pain   . Dyslipidemia     Patient Active Problem List   Diagnosis Date Noted  . 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 Hypercholesteremia 03/13/2013    Past Surgical History:  Procedure Laterality Date  . CORONARY ANGIOPLASTY          Home Medications    Prior to Admission  medications   Medication Sig Start Date End Date Taking? Authorizing Provider  ALPRAZolam Prudy Feeler(XANAX) 0.5 MG tablet Take 0.5 mg by mouth 2 (two) times daily as needed for anxiety.    [provider]  cyclobenzaprine (FLEXERIL) 10 MG tablet Take 1 tablet (10 mg total) by mouth 3 (three) times daily as needed for muscle spasms. 07/27/14   Street, [place], PA-C  DM-Phenylephrine-Acetaminophen (VICKS DAYQUIL MULTI-SYMPTOM) 10-5-325 MG CAPS Take 1 tablet by mouth daily as needed (pain).    [provider]    Family History Family History  Problem Relation Age of Onset  . Hypertension Mother   . Hypertension Father     Social History Social History   Tobacco Use  . Smoking status: Former Smoker    Quit date: 03/13/2010    Years since quitting: 8.6  Substance Use Topics  . Alcohol use: No  . Drug use: No     Allergies   Gluten meal and Penicillins   Review of Systems Review of Systems  All other systems reviewed and are negative.    Physical Exam Updated Vital Signs BP (!) 150/100 (BP Location: Right Arm)   Pulse 61   Temp 97.9 F (36.6 C) (Oral)   Resp 11   SpO2 97%   Physical Exam Vitals signs and nursing  note reviewed.  Constitutional:      General: He is not in acute distress.    Appearance: He is well-developed. He is obese. He is not ill-appearing.  HENT:     Head: Normocephalic.     Mouth/Throat:     Mouth: Mucous membranes are moist.  Eyes:     Extraocular Movements: Extraocular movements intact.  Cardiovascular:     Rate and Rhythm: Normal rate and regular rhythm.  Pulmonary:     Effort: Pulmonary effort is normal.     Breath sounds: Normal breath sounds.  Abdominal:     General: Abdomen is flat. Bowel sounds are normal.     Palpations: Abdomen is soft.     Tenderness: There is no abdominal tenderness.  Skin:    General: Skin is warm and dry.     Capillary Refill: Capillary refill takes less than 2 seconds.  Neurological:     Mental  Status: He is alert.  Psychiatric:        Mood and Affect: Mood normal.      ED Treatments / Results  Labs (all labs ordered are listed, but only abnormal results are displayed) Labs Reviewed  COMPREHENSIVE METABOLIC PANEL - Abnormal; Notable for the following components:      Result Value   CO2 21 (*)    Glucose, Bld 106 (*)    All other components within normal limits  CBC - Abnormal; Notable for the following components:   RBC 5.83 (*)    Hemoglobin 17.1 (*)    Platelets 134 (*)    All other components within normal limits  SARS CORONAVIRUS 2 (TAT 6-24 HRS)  LIPASE, BLOOD  URINALYSIS, ROUTINE W REFLEX MICROSCOPIC  TROPONIN I (HIGH SENSITIVITY)    EKG EKG Interpretation  Date/Time:  Tuesday November 17 2018 09:03:50 EDT Ventricular Rate:  65 PR Interval:  160 QRS Duration: 80 QT Interval:  402 QTC Calculation: 418 R Axis:   20 Text Interpretation:  Normal sinus rhythm Poor R wave progression Non-specific ST-t changes Poor data quality Confirmed by Pattricia Boss [phone]) on 11/17/2018 9:40:43 AM   Radiology Dg Chest Port 1 View  Result Date: 11/17/2018 CLINICAL DATA:  Dizziness, chest pain EXAM: PORTABLE CHEST 1 VIEW COMPARISON:  06/02/2007 FINDINGS: The heart size and mediastinal contours are within normal limits. Both lungs are clear. The visualized skeletal structures are unremarkable. IMPRESSION: No active disease. Electronically Signed   By: Kathreen Devoid   On: 11/17/2018 10:55    Procedures Procedures (including critical care time)  Medications Ordered in ED Medications  aspirin chewable tablet 324 mg (has no administration in time range)  nitroGLYCERIN (NITROSTAT) SL tablet 0.4 mg (has no administration in time range)  sodium chloride flush (NS) 0.9 % injection 3 mL (3 mLs Intravenous Given 11/17/18 0933)     Initial Impression / Assessment and Plan / ED Course  I have reviewed the triage vital signs and the nursing notes.  Pertinent labs & imaging  results that were available during my care of the patient were reviewed by me and considered in my medical decision making (see chart for details).    Patient with epigastric and chest discomfort.  Nonspecific ekg.  Heart score 5.  Patient pain free with aspirin (nitro ordered but not given).  Repeat troponin pending. Plan discharge if normal   Repeat troponin normal Chest pain DDX ACS/STEMI Dissection PE Lung infection/covid Upper abdominal pain/chest pain Pancreatitis Pud gb dz  Doubt above based on history, work  up and imaging Discussed follow up and return precautions   Final Clinical Impressions(s) / ED Diagnoses   Final diagnoses:  Chest pain, unspecified type    ED Discharge Orders    None       Margarita Grizzle, MD 11/17/18 1329

## 2018-11-17 NOTE — Discharge Instructions (Addendum)
Please follow up with your doctor Return if worsening chest pain or new symptoms.

## 2018-11-17 NOTE — ED Triage Notes (Signed)
Pt arrives via EMS from workplace where he developed LUQ abdominal pain around 7am, had chest palpitations with the pain. Denies n/v/fever. No hx of same. Pt alert, oriented x4, ambulatory. EKG SR. 18g LAC. cbg 92.

## 2020-05-17 IMAGING — DX DG CHEST 1V PORT
1 series · 1 of 1 positions shown · non-contrast
Comparison: 06/02/2007

CLINICAL DATA: Dizziness, chest pain

EXAM:
PORTABLE CHEST 1 VIEW

[chest]
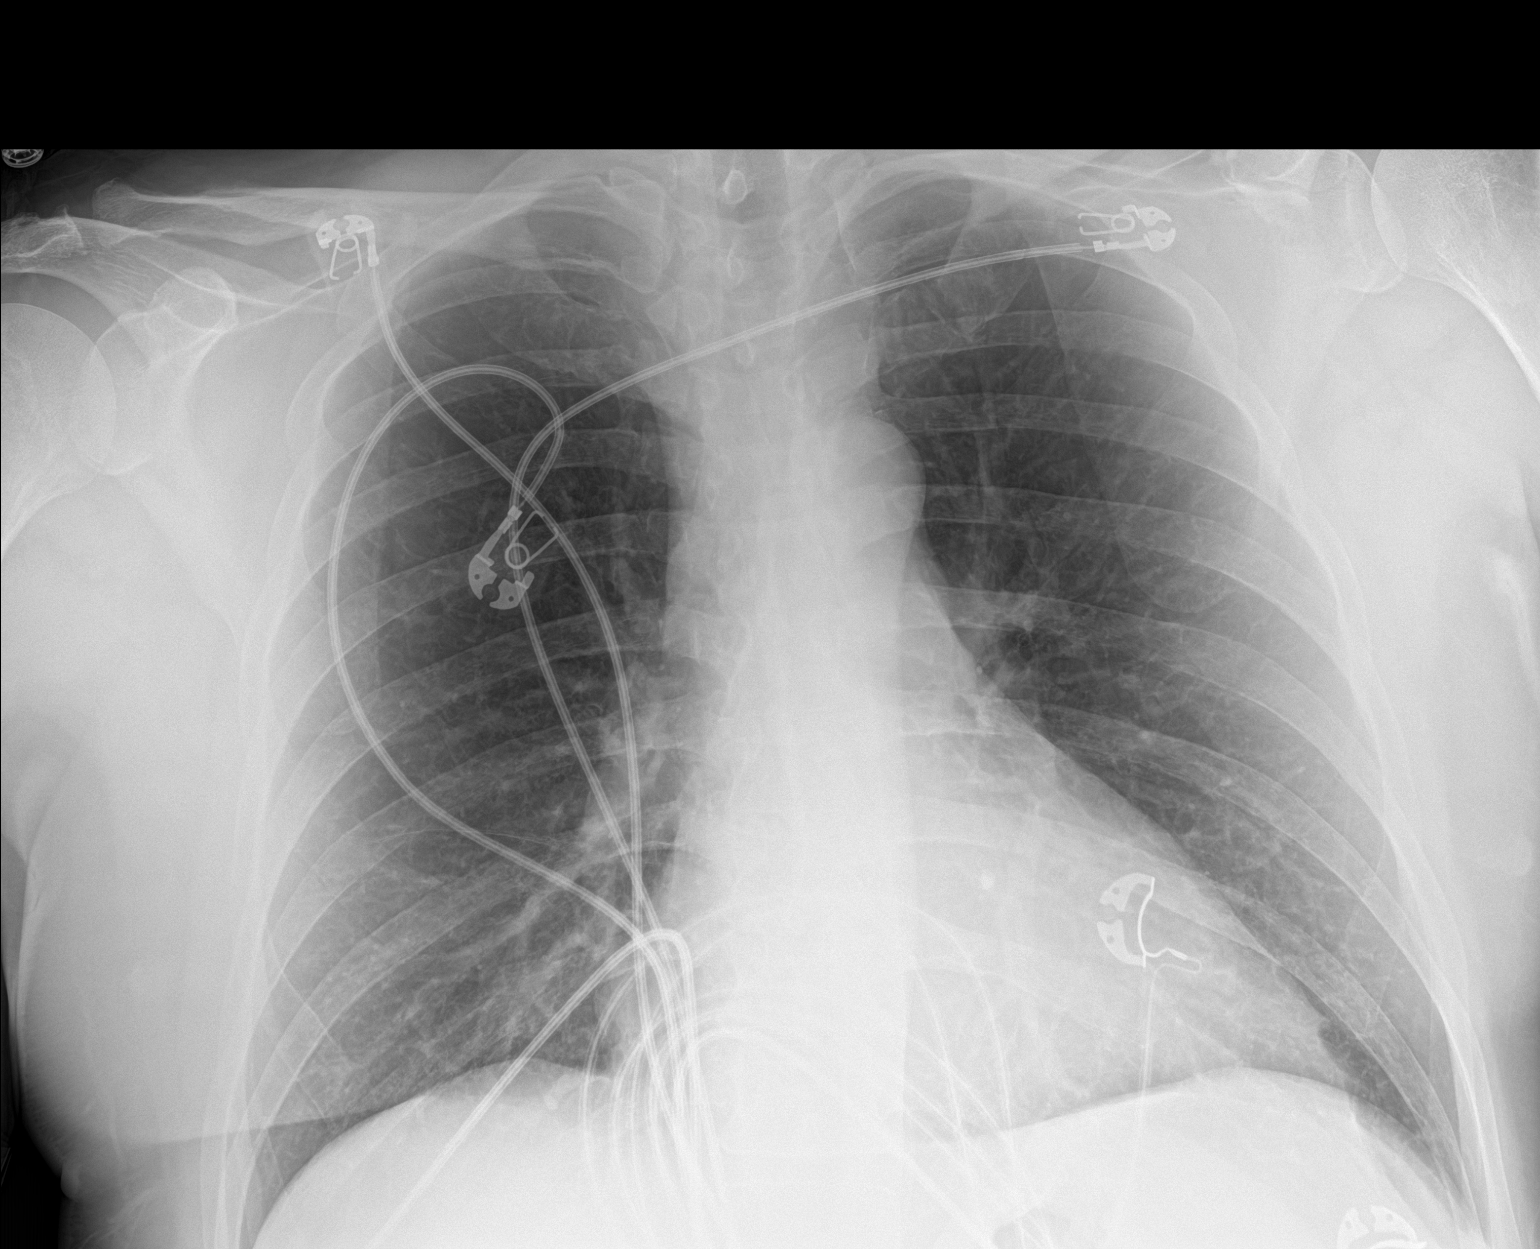

[1 of 1 positions shown; findings below may reference images not displayed]

FINDINGS: The heart size and mediastinal contours are within normal limits.
Both lungs are clear. The visualized skeletal structures are
unremarkable.
IMPRESSION: No active disease.

## 2020-12-11 ENCOUNTER — Emergency Department (HOSPITAL_COMMUNITY)
Admission: EM | Admit: 2020-12-11 | Discharge: 2020-12-11 | Disposition: A | Discharge status: Home / Self Care | Payer: BLUE CROSS/BLUE SHIELD | Attending: Emergency Medicine | Admitting: Emergency Medicine

## 2020-12-11 ENCOUNTER — Other Ambulatory Visit: Payer: Self-pay

## 2020-12-11 ENCOUNTER — Encounter (HOSPITAL_COMMUNITY): Payer: Self-pay

## 2020-12-11 DIAGNOSIS — S6991XA Unspecified injury of right wrist, hand and finger(s), initial encounter: Secondary | ICD-10-CM | POA: Diagnosis not present

## 2020-12-11 DIAGNOSIS — Z951 Presence of aortocoronary bypass graft: Secondary | ICD-10-CM | POA: Insufficient documentation

## 2020-12-11 DIAGNOSIS — Y93G3 Activity, cooking and baking: Secondary | ICD-10-CM | POA: Insufficient documentation

## 2020-12-11 DIAGNOSIS — W268XXA Contact with other sharp object(s), not elsewhere classified, initial encounter: Secondary | ICD-10-CM | POA: Insufficient documentation

## 2020-12-11 DIAGNOSIS — S61216A Laceration without foreign body of right little finger without damage to nail, initial encounter: Secondary | ICD-10-CM | POA: Insufficient documentation

## 2020-12-11 DIAGNOSIS — Z87891 Personal history of nicotine dependence: Secondary | ICD-10-CM | POA: Insufficient documentation

## 2020-12-11 NOTE — ED Provider Notes (Signed)
[facility] EMERGENCY DEPARTMENT Provider Note   CSN: [id number] Arrival date & time: 12/11/20  1722     History Chief Complaint  Patient presents with   Laceration    Charles Villanueva is a 50 y.o. male presented emergency department with filet type laceration to his right fifth finger.  He reports he cut it on food blender.  This occurred around 4 pm.  He did try to hold pressure but he continues to bleed.  He is not on blood thinners but takes a baby aspirin every day.  No other injuries reported.  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 Authorizing Provider  ibuprofen (ADVIL) 200 MG tablet Take 200 mg by mouth every 6 (six) hours as needed for moderate pain.    [provider]    Allergies    Gluten meal and Penicillins  Review of Systems   Review of Systems  Constitutional:  Negative for chills and fever.  Eyes:  Negative for visual disturbance.  Respiratory:  Negative for cough and shortness of breath.   Cardiovascular:  Negative for chest pain and palpitations.  Gastrointestinal:  Negative for abdominal pain and vomiting.  Musculoskeletal:  Negative for arthralgias and back pain.  Skin:  Positive for rash and wound.  Neurological:  Negative for  syncope and headaches.  All other systems reviewed and are negative.  Physical Exam Updated Vital Signs BP (!) 142/109 (BP Location: Right Arm)   Pulse 81   Temp 98.6 F (37 C) (Oral)   Resp 18   Ht 5\' 9"  (1.753 m)   Wt 104.3 kg   SpO2 99%   BMI 33.97 kg/m   Physical Exam Constitutional:      General: He is not in acute distress. HENT:     Head: Normocephalic and atraumatic.  Eyes:     Conjunctiva/sclera: Conjunctivae normal.     Pupils: Pupils are equal, round, and reactive to light.  Cardiovascular:     Rate and Rhythm: Normal rate and regular rhythm.  Pulmonary:     Effort: Pulmonary effort is normal. No respiratory distress.  Skin:    General: Skin is warm and dry.     Comments: 1/2 cm filet laceration to lateral aspect of distal right 5th finger (pinky), slow drip bleed  Neurological:     General: No focal deficit present.     Mental Status: He is alert. Mental status is at baseline.  Psychiatric:        Mood and Affect: Mood normal.        Behavior: Behavior normal.    ED Results / Procedures / Treatments  Labs (all labs ordered are listed, but only abnormal results are displayed) Labs Reviewed - No data to display  EKG None  Radiology No results found.  Procedures Procedures   Medications Ordered in ED Medications - No data to display  ED Course  I have reviewed the triage vital signs and the nursing notes.  Pertinent labs & imaging results that were available during my care of the patient were reviewed by me and considered in my medical decision making (see chart for details).  No open laceration amenable to suturing We cleaned wound, applied ice, pressure, clotting agent.  Hemostasis achieved.  Doubt underlying fx.  No evidence of infection.  Wound care instructions provided.  Patient okay for discharge.  Clinical Course as of 12/11/20 2202  Mon Dec 11, 2020  1831 No further bleeding, okay for discharge [MT]    Clinical Course User  Index [MT] Juda Toepfer, Carola Rhine, MD     Final Clinical Impression(s) / ED Diagnoses Final diagnoses:  Laceration of right little finger without damage to nail, foreign body presence unspecified, initial encounter    Rx / DC Orders ED Discharge Orders     None        Wyvonnia Dusky, MD 12/11/20 2202

## 2020-12-11 NOTE — Discharge Instructions (Signed)
Your bleeding should stop completely by tonight.  If you start oozing again, apply an ice cube to the wound for 60 seconds.  Then wipe it dry and apply the clot powder.  Then hold steady pressure DIRECTLY on the wound with your fingers for 10-15 minutes without letting go.

## 2020-12-11 NOTE — ED Triage Notes (Signed)
Pt states he was using a food slicer and was cutting a vegetable and sliced his finger and can't get it to stop.  "It was spurting but I know it wasn't an artery."

## 2021-07-24 DIAGNOSIS — R7301 Impaired fasting glucose: Secondary | ICD-10-CM | POA: Diagnosis not present

## 2021-07-24 DIAGNOSIS — E782 Mixed hyperlipidemia: Secondary | ICD-10-CM | POA: Diagnosis not present

## 2021-07-24 DIAGNOSIS — I10 Essential (primary) hypertension: Secondary | ICD-10-CM | POA: Diagnosis not present

## 2021-07-24 DIAGNOSIS — F411 Generalized anxiety disorder: Secondary | ICD-10-CM | POA: Diagnosis not present

## 2021-07-24 DIAGNOSIS — I2510 Atherosclerotic heart disease of native coronary artery without angina pectoris: Secondary | ICD-10-CM | POA: Diagnosis not present

## 2021-08-03 DIAGNOSIS — E118 Type 2 diabetes mellitus with unspecified complications: Secondary | ICD-10-CM | POA: Diagnosis not present

## 2021-08-03 DIAGNOSIS — I10 Essential (primary) hypertension: Secondary | ICD-10-CM | POA: Diagnosis not present

## 2021-08-03 DIAGNOSIS — E782 Mixed hyperlipidemia: Secondary | ICD-10-CM | POA: Diagnosis not present

## 2021-09-05 DIAGNOSIS — F411 Generalized anxiety disorder: Secondary | ICD-10-CM | POA: Diagnosis not present

## 2021-09-05 DIAGNOSIS — I10 Essential (primary) hypertension: Secondary | ICD-10-CM | POA: Diagnosis not present

## 2021-09-05 DIAGNOSIS — E118 Type 2 diabetes mellitus with unspecified complications: Secondary | ICD-10-CM | POA: Diagnosis not present

## 2021-09-05 DIAGNOSIS — E782 Mixed hyperlipidemia: Secondary | ICD-10-CM | POA: Diagnosis not present

## 2023-08-26 ENCOUNTER — Other Ambulatory Visit (HOSPITAL_BASED_OUTPATIENT_CLINIC_OR_DEPARTMENT_OTHER): Payer: Self-pay | Admitting: Family Medicine

## 2023-08-26 DIAGNOSIS — I2510 Atherosclerotic heart disease of native coronary artery without angina pectoris: Secondary | ICD-10-CM

## 2023-08-26 DIAGNOSIS — E782 Mixed hyperlipidemia: Secondary | ICD-10-CM

## 2023-09-10 ENCOUNTER — Ambulatory Visit (HOSPITAL_BASED_OUTPATIENT_CLINIC_OR_DEPARTMENT_OTHER)
Admission: RE | Admit: 2023-09-10 | Discharge: 2023-09-10 | Disposition: A | Discharge status: Home / Self Care | Payer: Self-pay | Source: Ambulatory Visit | Attending: Family Medicine | Admitting: Family Medicine

## 2023-09-10 DIAGNOSIS — I2510 Atherosclerotic heart disease of native coronary artery without angina pectoris: Secondary | ICD-10-CM | POA: Insufficient documentation

## 2023-09-10 DIAGNOSIS — E782 Mixed hyperlipidemia: Secondary | ICD-10-CM | POA: Insufficient documentation

## 2023-10-31 ENCOUNTER — Ambulatory Visit: Payer: Self-pay | Admitting: Internal Medicine

## 2023-11-03 ENCOUNTER — Ambulatory Visit: Admitting: Internal Medicine

## 2023-11-04 ENCOUNTER — Encounter: Payer: Self-pay | Admitting: Cardiology

## 2023-11-04 ENCOUNTER — Ambulatory Visit: Attending: Cardiology | Admitting: Cardiology

## 2023-11-04 VITALS — BP 163/103 | HR 58 | Ht 69.0 in | Wt 230.0 lb

## 2023-11-04 DIAGNOSIS — E781 Pure hyperglyceridemia: Secondary | ICD-10-CM

## 2023-11-04 DIAGNOSIS — E782 Mixed hyperlipidemia: Secondary | ICD-10-CM | POA: Diagnosis not present

## 2023-11-04 DIAGNOSIS — I25810 Atherosclerosis of coronary artery bypass graft(s) without angina pectoris: Secondary | ICD-10-CM | POA: Diagnosis not present

## 2023-11-04 MED ORDER — ROSUVASTATIN CALCIUM 5 MG PO TABS: 5.0000 mg | ORAL_TABLET | Freq: Every day | ORAL | 3 refills | Status: DC

## 2023-11-04 NOTE — Patient Instructions (Signed)
 Medication Instructions:  Please start Crestor (Rosuvastatin) 5 mg once daily. Continue all other medications as listed.  *If you need a refill on your cardiac medications before your next appointment, please call your pharmacy*  You have been referred to the Lipid Clinic.  Follow-Up: At [facility], you and your health needs are our priority.  As part of our continuing mission to provide you with exceptional heart care, our providers are all part of one team.  This team includes your primary Cardiologist (physician) and Advanced Practice Providers or APPs (Physician Assistants and Nurse Practitioners) who all work together to provide you with the care you need, when you need it.  Your next appointment:   1 year(s)  Provider:   Dr Oneil Parchment     We recommend signing up for the patient portal called MyChart.  Sign up information is provided on this After Visit Summary.  MyChart is used to connect with patients for Virtual Visits (Telemedicine).  Patients are able to view lab/test results, encounter notes, upcoming appointments, etc.  Non-urgent messages can be sent to your provider as well.   To learn more about what you can do with MyChart, go to [email or web address].

## 2023-11-04 NOTE — Progress Notes (Signed)
 Cardiology Office Note:  .   Date:  11/04/2023  ID:  Charles Villanueva, DOB [birth year], MRN [id number] PCP: Charles Elsie SAUNDERS, MD  [facility] Providers Cardiologist:  None    History of Present Illness: .   Charles Villanueva is a 53 y.o. male Discussed the use of AI scribe software for clinical note transcription with the patient, who gave verbal consent to proceed.  History of Present Illness Charles Villanueva is a 53 year old male with coronary artery disease who presents for evaluation of coronary artery disease. He was referred by Charles Villanueva for evaluation of coronary artery disease.  He underwent a coronary calcium score on September 10, 2023, which was 128, placing him in the 83rd percentile. The scan revealed calcium in the proximal and mid LAD, aortic atherosclerosis, and emphysema in the lungs. He has a history of heart catheterization in 2009, which showed 60% stenosis in the first diagonal branch and borderline obstructive disease in a branch vessel of the first diagonal with normal LV function.  He has a history of diabetes, hypertension, and hyperlipidemia. His triglycerides have been greater than 1500 in the past, and he is intolerant of statin therapy due to muscle cramps. He is currently taking fenofibrate 145 mg daily, which has reduced his triglycerides from 1500 to 1148 as of June 2025. He also takes losartan hydrochlorothiazide 100/25 mg daily for hypertension and aspirin  81 mg daily. His last LDL was 76.  No chest discomfort or shortness of breath with physical activity, such as walking on a treadmill for 10-15 minutes daily. He quit smoking cigarettes 10 years ago and has not vaped recently. He has a history of obstructive sleep apnea and uses a CPAP machine, which has reduced his events per hour from 44 to an average of 3. He experiences occasional calf cramping at night, which he attributes to electrolyte imbalances.  He has a family history of high triglycerides, which he believes  is genetic, as his mother had similar issues. He has not experienced pancreatitis. He manages his diabetes with dietary changes, reducing carbohydrate intake significantly, and supplements like berberine and cinnamon. His last A1c was 7.1, but he reports it was down to 4.9 a month ago.  No alcohol use.  Works for [employer] that is out of [employer].     Studies Reviewed: SABRA   EKG Interpretation Date/Time:  Tuesday November 04 2023 09:21:42 EDT Ventricular Rate:  58 PR Interval:  166 QRS Duration:  96 QT Interval:  436 QTC Calculation: 428 R Axis:   38  Text Interpretation: Sinus bradycardia Incomplete right bundle branch block Possible Anterior infarct , age undetermined When compared with ECG of 17-Nov-2018 09:03, No significant change was found Confirmed by Jeffrie Anes ([id number]) on 11/04/2023 9:39:03 AM    Results LABS Triglycerides: 1148 (07/2023) LDL: 76 A1c: 7.1  RADIOLOGY Coronary calcium score: 128, 83rd percentile, calcium in proximal and mid LAD, aortic atherosclerosis, emphysema in lungs (09/10/2023)  DIAGNOSTIC Cardiac catheterization: Borderline obstructive disease in branch vessel of first diagonal, normal LV function (07/15/2007) CPAP: Events per hour reduced from 44 to 3 Risk Assessment/Calculations:           Physical Exam:   VS:  BP (!) 163/103   Pulse (!) 58   Ht 5' 9 (1.753 m)   Wt 230 lb (104.3 kg)   SpO2 99%   BMI 33.97 kg/m    Wt Readings from Last 3 Encounters:  11/04/23 230 lb (104.3 kg)  12/11/20  230 lb (104.3 kg)    GEN: Well nourished, well developed in no acute distress NECK: No JVD; No carotid bruits CARDIAC: RRR, no murmurs, no rubs, no gallops RESPIRATORY:  Clear to auscultation without rales, wheezing or rhonchi  ABDOMEN: Soft, non-tender, non-distended EXTREMITIES:  No edema; No deformity   ASSESSMENT AND PLAN: .    Assessment and Plan Assessment & Plan Coronary artery disease with coronary calcification Coronary  artery disease with calcified plaque in the proximal and mid LAD. Calcium score of 128, 83rd percentile. No current symptoms of angina or exertional chest pain. 60% stenosis in the first diagonal branch noted in past catheterization. Statin intolerance due to muscle cramps and calf tear. - Start Crestor (rosuvastatin) 5 mg once daily to assess tolerance and effect on plaque stabilization. - Monitor for muscle cramps or other side effects from Crestor. - Continue aspirin  81 mg daily.  Severe hypertriglyceridemia and mixed hyperlipidemia, statin intolerant Severe hypertriglyceridemia with levels previously over 1500 mg/dL, reduced to 8851 mg/dL with fenofibrate. Genetic predisposition noted. No history of pancreatitis. Statin intolerance due to muscle cramps. - Refer to lipid clinic for comprehensive evaluation and management of hypertriglyceridemia. - Continue fenofibrate 145 mg daily. - Continue omega-3 supplements. - Starting Crestor 5 mg once a day.  We will retrial this.  Obviously if he has significant leg cramping or calf cramping we may need to proceed with an alternative.  He continues to work on diet and exercise, increasing his daily walking.  Hypertension, suboptimally controlled Hypertension with recent home blood pressure reading of 150/89 mmHg. Currently on losartan hydrochlorothiazide 100/25 mg daily. White coat hypertension noted. - Continue losartan hydrochlorothiazide 100/25 mg daily. - Monitor blood pressure at home regularly. - Encourage increased physical activity, aiming for 20-30 minutes of walking daily.  Uses treadmill.  Type 2 diabetes mellitus, diet controlled Type 2 diabetes mellitus with recent A1c of 4.9%. Controlled through dietary modifications including reduced carbohydrate intake and use of supplements like berberine and cinnamon.  Obstructive sleep apnea, improved with CPAP Obstructive sleep apnea with significant improvement on CPAP therapy. Events per hour  reduced from 44 to an average of 3. - Continue CPAP therapy.           Signed, Oneil Parchment, MD

## 2023-12-08 ENCOUNTER — Ambulatory Visit: Payer: Self-pay | Attending: Cardiology | Admitting: Pharmacist Clinician (PhC)/ Clinical Pharmacy Specialist

## 2023-12-08 VITALS — BP 142/90 | HR 72 | Ht 69.0 in | Wt 243.8 lb

## 2023-12-08 DIAGNOSIS — E782 Mixed hyperlipidemia: Secondary | ICD-10-CM | POA: Diagnosis not present

## 2023-12-08 DIAGNOSIS — I1A0 Resistant hypertension: Secondary | ICD-10-CM

## 2023-12-08 MED ORDER — OLMESARTAN MEDOXOMIL 40 MG PO TABS: 40.0000 mg | ORAL_TABLET | Freq: Every day | ORAL | 1 refills | Status: AC

## 2023-12-08 NOTE — Progress Notes (Unsigned)
 Office Visit    Patient Name: Charles Villanueva Date of Encounter: 12/09/2023  Primary Care Provider:  Arloa Elsie SAUNDERS, MD Primary Cardiologist:  None  Chief Complaint    Hyperlipidemia (hypertriglyceridemia)  Significant Past Medical History   CAD 8/25 - CAC 128 (83rd percentile); 2009 cath w 60% 1st Dx  DM2 6/25 A1c 7.1 (pt reports lower now)  HTN BP elevated at last visit (163/103) on losartan hctz 100/25           Allergies  Allergen Reactions   Gluten Meal     Stomach cramps, dizziness   Penicillins Itching    Did it involve swelling of the face/tongue/throat, SOB, or low BP? Unknown Did it involve sudden or severe rash/hives, skin peeling, or any reaction on the inside of your mouth or nose? Unknown Did you need to seek medical attention at a hospital or doctor's office? Unknown When did it last happen?   Pt cant remember he was 53yrs old    If all above answers are "NO", may proceed with cephalosporin use.     History of Present Illness    Charles Villanueva is a 53 y.o. male patient of Dr Jeffrie, in the office today to discuss options for cholesterol management.  He has a family history of elevated triglycerides, and his have been as high as 1500.   He notes that his father has high trigs as well, although hers have not been nearly as high.  Patient also has questions about his blood pressure, noting that 140-160 systolic is normal, and diastolic often in the 90's.  Currently takes losartan hctz 100/25.  Triglyceride goal:  < 150  BP goal < 130/80  Current Medications:   fenofibrate 145 mg daily, Lovaza 2 gm bid, rosuvastatin 5 mg daily  Family Hx:   thinks father had trigs, but never > 500, both had high LDL ; both parents deceased, mother had bad heart, died rom DM/CKD; father died cancer; brother thyroid issues, never mintioned chol, low sodium; 4 kids 18,24,22,19;  54 yr old had 21 trigs  Social Hx: Tobacco: no Alcohol:  no    Diet:   last meal 8:30 pm, next  10:30 am - breakfast egg -2 with avocado; sardines about once weekly; 2 pm salad, occasioal fast food if working - about once weekly; dinner chicken, beef, salmon; trying to limit rice and potatoes to once twice weekly - trying black rice ; snacking - poptart at night;    Cut about all refined carbs  Exercise: 10-15 min  Accessory Clinical Findings   6/25 (in [facility])  TC 282, TG 1148, HDL 27, LDL 54  No results found for: LIPOA  Lab Results  Component Value Date   ALT 27 11/17/2018   AST 21 11/17/2018   ALKPHOS 48 11/17/2018   BILITOT 1.1 11/17/2018   Lab Results  Component Value Date   CREATININE 1.07 11/17/2018   BUN 12 11/17/2018   NA 136 11/17/2018   K 4.0 11/17/2018   CL 104 11/17/2018   CO2 21 (L) 11/17/2018   No results found for: HGBA1C  Home Medications    Current Outpatient Medications  Medication Sig Dispense Refill   olmesartan (BENICAR) 40 MG tablet Take 1 tablet (40 mg total) by mouth daily. 90 tablet 1   Accu-Chek Softclix Lancets lancets as directed finger stick as directed; Duration: 90 days     ALPRAZolam (XANAX) 0.5 MG tablet Take 0.5 mg by mouth 2 (two) times daily as  needed.     aspirin  81 MG chewable tablet Chew 81 mg by mouth once.     Blood Glucose Monitoring Suppl (ACCU-CHEK GUIDE ME) w/Device KIT See admin instructions.     Cholecalciferol 125 MCG (5000 UT) TABS 1 tablet Orally Once a day     fenofibrate (TRICOR) 145 MG tablet Take 145 mg by mouth daily.     Menatetrenone (VITAMIN K2) 100 MCG TABS as directed Orally daily     omega-3 acid ethyl esters (LOVAZA) 1 g capsule 3-4 capsules Orally once a day     rosuvastatin (CRESTOR) 5 MG tablet Take 1 tablet (5 mg total) by mouth daily. 90 tablet 3   sertraline (ZOLOFT) 50 MG tablet Take 50 mg by mouth daily.     No current facility-administered medications for this visit.     Assessment & Plan    Mixed hyperlipidemia Assessment: Patient with hypertriglyceridemia not at goal of < 150 Most  recent TG 1148 in June Has been compliant with fenofibrate 145 mg daily, Lovaza 2 gm bid, rosuvastatin 5 mg daily Patient reports A1c recently checked (no results in [facility]) stating A1c down below 6. Has drastically changed eating habits, limiting starches to once or twice weekly, more fish (salmon), less eating out.  Plan: Will have patient take fenofibrate with meal to potentially increase absorption. Repeat labs in the next week to see if changes in diet over past 3-4 months have improved triglycerides.    Patient was given information on [employer] - will sign patient up when PA approved Marital status Income < $72,000 (single) or < $102,000 (married)   Hypertension Assessment: BP is uncontrolled in office BP 142/90 mmHg;  above the goal (<130/80). Tolerates losartan hctz well, without any side effects Denies SOB, palpitation, chest pain, headaches,or swelling Reiterated the importance of regular exercise and low salt diet   Plan:  Stop taking losartan hctz Start taking olmesartan 40 mg daily Patient to keep record of BP readings with heart rate and report to us  at the next visit Patient to follow up with me in 6 weeks  Labs ordered today:  none   Allean Mink, PharmD CPP [facility] [address]   [place], [place] [address] [phone]  12/09/2023, 9:07 AM

## 2023-12-08 NOTE — Patient Instructions (Addendum)
 Follow up appointment:  Wednesday December 17 at 9 am  Your Results:             Your most recent labs Goal  Total Cholesterol 282 < 200  Triglycerides 1148 < 150  HDL (happy/good cholesterol) 27 > 40  LDL (lousy/bad cholesterol 54 < 70   Medication changes:  Take the fenofibrate with a meal.  Continue with fish oil 2 capsules twice daily   Stop losartan.  Start olmesartan 40 mg once daily.  It may take a week before you notice any changes in blood pressure.  Lab orders:  Get cholesterol labs drawn sometime in the next week.  Please be fasting.    High Triglycerides Eating Plan Triglycerides are a type of fat in the blood. High levels of triglycerides can increase your risk of heart disease and stroke. If your triglyceride levels are high, choosing the right foods can help lower your triglycerides and keep your heart healthy. Work with your health care provider or a dietitian to develop an eating plan that is right for you. What are tips for following this plan? General guidelines  Lose weight, if you are overweight. For most people, losing 5-10 lb (2-5 kg) helps lower triglyceride levels. A weight-loss plan may include: 30 minutes of exercise at least 5 days a week. Reducing the amount of calories, sugar, and fat you eat. Eat a wide variety of fresh fruits, vegetables, and whole grains. These foods are high in fiber. Eat foods that contain healthy fats, such as fatty fish, nuts, seeds, and olive oil. Avoid foods that are high in added sugar, added salt (sodium), and saturated fat. Avoid low-fiber, refined carbohydrates such as white bread, crackers, noodles, and white rice. Avoid foods with trans fats or partially hydrogenated oils, such as fried foods or stick margarine. If you drink alcohol: Limit how much you have to: 0-1 drink a day for women who are not pregnant. 0-2 drinks a day for men. Your health care provider may recommend that you drink less than these amounts  depending on your overall health. Know how much alcohol is in a drink. In the [place] one drink equals one 12 oz bottle of beer (355 mL), one 5 oz glass of wine (148 mL), or one 1 oz glass of hard liquor (44 mL). Reading food labels Check food labels for: The amount of saturated fat. Choose foods with no or very little saturated fat (less than 2 g). The amount of trans fat. Choose foods with no transfat. The amount of cholesterol. Choose foods that are low in cholesterol. The amount of sodium. Choose foods with less than 140 milligrams (mg) per serving. Shopping Buy dairy products labeled as nonfat (skim) or low-fat (1%). Avoid buying processed or prepackaged foods. These are often high in added sugar, sodium, and fat. Cooking Choose healthy fats when cooking, such as olive oil, avocado oil, or canola oil. Cook foods using lower fat methods, such as baking, broiling, boiling, or grilling. Make your own sauces, dressings, and marinades when possible, instead of buying them. Store-bought sauces, dressings, and marinades are often high in sodium and sugar. Meal planning Eat more home-cooked food and less restaurant, buffet, and fast food. Eat fatty fish at least 2 times each week. Examples of fatty fish include salmon, trout, sardines, mackerel, tuna, and herring. If you eat whole eggs, do not eat more than 4 egg yolks per week.  What foods should I eat?  Fruits All fresh, canned (in natural  juice), or frozen fruits. Vegetables Fresh or frozen vegetables. Low-sodium canned vegetables. Grains Whole wheat or whole grain breads, crackers, cereals, and pasta. Unsweetened oatmeal. Bulgur. Barley. Quinoa. Brown rice. Whole wheat flour tortillas. Meats and other proteins Skinless chicken or turkey. Ground chicken or turkey. Lean cuts of pork, trimmed of fat. Fish and seafood, especially salmon, trout, and herring. Egg whites. Dried beans, peas, or lentils. Unsalted nuts or seeds. Unsalted canned  beans. Natural peanut or almond butter or other nut butters. Dairy Low-fat dairy products. Skim or low-fat (1%) milk. Reduced fat (2%) and low-sodium cheese. Low-fat ricotta cheese. Low-fat cottage cheese. Plain, low-fat yogurt. Fats and oils Tub margarine without trans fats. Light or reduced-fat mayonnaise. Light or reduced-fat salad dressings. Avocado. Safflower, olive, sunflower, soybean, and canola oils. The items listed above may not be a complete list of recommended foods and beverages. Talk with your dietitian about what dietary choices are best for you.  What foods should I avoid?  Fruits Sweetened dried fruit. Canned fruit in syrup. Fruit juice. Vegetables Creamed or fried vegetables. Vegetables in a cheese sauce. Grains White bread. White (regular) pasta. White rice. Cornbread. Bagels. Pastries. Crackers that contain trans fat. Meats and other proteins Fatty cuts of meat. Ribs. Chicken wings. [place]. Sausage. Bologna. Salami. Chitterlings. Fatback. Hot dogs. Bratwurst. Packaged lunch meats. Dairy Whole or reduced-fat (2%) milk. Half-and-half. Cream cheese. Full-fat or sweetened yogurt. Full-fat cheese. Nondairy creamers. Whipped toppings. Processed cheese or cheese spreads. Cheese curds. Fats and oils Butter. Stick margarine. Lard. Shortening. Ghee. Bacon fat. Tropical oils, such as coconut, palm kernel, or palm oils. Beverages Alcohol. Sweetened drinks, such as soda, lemonade, fruit drinks, or punches. Sweets and desserts Corn syrup. Sugars. Honey. Molasses. Candy. Jam and jelly. Syrup. Sweetened cereals. Cookies. Pies. Cakes. Donuts. Muffins. Ice cream. Condiments Store-bought sauces, dressings, and marinades that are high in sugar, such as ketchup and barbecue sauce. The items listed above may not be a complete list of foods and beverages you should avoid. Talk with your dietitian about what dietary choices are best for you. Summary High levels of triglycerides can increase  the risk of heart disease and stroke. Choosing the right foods can help lower your triglycerides. Eat plenty of fresh fruits, vegetables, and whole grains. Choose low-fat dairy and lean meats. Eat fatty fish at least twice a week. Avoid processed and prepackaged foods with added sugar, sodium, saturated fat, and trans fat. If you need suggestions or have questions about what types of food are good for you, talk with your health care provider or a dietitian. This information is not intended to replace advice given to you by your health care provider. Make sure you discuss any questions you have with your health care provider. Document Revised: 06/02/2020 Document Reviewed: 06/02/2020 Elsevier Patient Education  2024 [employer].   Thank you for choosing [employer]

## 2023-12-09 ENCOUNTER — Encounter: Payer: Self-pay | Admitting: Pharmacist Clinician (PhC)/ Clinical Pharmacy Specialist

## 2023-12-09 DIAGNOSIS — I10 Essential (primary) hypertension: Secondary | ICD-10-CM | POA: Insufficient documentation

## 2023-12-09 NOTE — Assessment & Plan Note (Signed)
 Assessment: BP is uncontrolled in office BP 142/90 mmHg;  above the goal (<130/80). Tolerates losartan hctz well, without any side effects Denies SOB, palpitation, chest pain, headaches,or swelling Reiterated the importance of regular exercise and low salt diet   Plan:  Stop taking losartan hctz Start taking olmesartan 40 mg daily Patient to keep record of BP readings with heart rate and report to us  at the next visit Patient to follow up with me in 6 weeks  Labs ordered today:  none

## 2023-12-09 NOTE — Assessment & Plan Note (Signed)
 Assessment: Patient with hypertriglyceridemia not at goal of < 150 Most recent TG 1148 in June Has been compliant with fenofibrate 145 mg daily, Lovaza 2 gm bid, rosuvastatin 5 mg daily Patient reports A1c recently checked (no results in [facility]) stating A1c down below 6. Has drastically changed eating habits, limiting starches to once or twice weekly, more fish (salmon), less eating out.  Plan: Will have patient take fenofibrate with meal to potentially increase absorption. Repeat labs in the next week to see if changes in diet over past 3-4 months have improved triglycerides.    Patient was given information on [employer] - will sign patient up when PA approved Marital status Income < $72,000 (single) or < $102,000 (married)

## 2023-12-10 ENCOUNTER — Other Ambulatory Visit: Payer: Self-pay | Admitting: Medical Genetics

## 2024-01-05 ENCOUNTER — Encounter: Payer: Self-pay | Admitting: Pharmacist Clinician (PhC)/ Clinical Pharmacy Specialist

## 2024-01-07 LAB — LIPID PANEL
Chol/HDL Ratio: 18.8 ratio — ABNORMAL HIGH (ref 0.0–5.0)
Cholesterol, Total: 319 mg/dL — ABNORMAL HIGH (ref 100–199)
HDL: 17 mg/dL — ABNORMAL LOW (ref >39)
Triglycerides: 1402 mg/dL (ref 0–149)

## 2024-01-21 ENCOUNTER — Ambulatory Visit: Payer: Self-pay | Admitting: Pharmacist Clinician (PhC)/ Clinical Pharmacy Specialist

## 2024-01-21 ENCOUNTER — Encounter: Payer: Self-pay | Admitting: Pharmacist Clinician (PhC)/ Clinical Pharmacy Specialist

## 2024-01-21 ENCOUNTER — Ambulatory Visit: Attending: Cardiovascular Disease | Admitting: Pharmacist Clinician (PhC)/ Clinical Pharmacy Specialist

## 2024-01-21 VITALS — BP 128/90 | HR 56

## 2024-01-21 DIAGNOSIS — I1A0 Resistant hypertension: Secondary | ICD-10-CM | POA: Diagnosis not present

## 2024-01-21 NOTE — Patient Instructions (Signed)
 Follow up appointment: IN 3 MONTHS - I WILL REACH OUT IN JANUARY TO SCHEDULE  Take your BP meds as follows:  START AMLODIPINE 2.5 MG ONCE DAILY  CONTINUE WITH OLMESARTAN  40 MG ONCE DAILY  Check your blood pressure at home THREE DAYS PER WEEK and keep record of the readings.  Your blood pressure goal is < 130/80  To check your pressure at home you will need to:  1. Sit up in a chair, with feet flat on the floor and back supported. Do not cross your ankles or legs. 2. Rest your left arm so that the cuff is about heart level. If the cuff goes on your upper arm,  then just relax the arm on the table, arm of the chair or your lap. If you have a wrist cuff, we  suggest relaxing your wrist against your chest (think of it as Pledging the Flag with the  wrong arm).  3. Place the cuff snugly around your arm, about 1 inch above the crook of your elbow. The  cords should be inside the groove of your elbow.  4. Sit quietly, with the cuff in place, for about 5 minutes. After that 5 minutes press the power  button to start a reading. 5. Do not talk or move while the reading is taking place.  6. Record your readings on a sheet of paper. Although most cuffs have a memory, it is often  easier to see a pattern developing when the numbers are all in front of you.  7. You can repeat the reading after 1-3 minutes if it is recommended  Make sure your bladder is empty and you have not had caffeine or tobacco within the last 30 min  Always bring your blood pressure log with you to your appointments. If you have not brought your monitor in to be double checked for accuracy, please bring it to your next appointment.  You can find a list of quality blood pressure cuffs at [email or web address]  Important lifestyle changes to control high blood pressure  Intervention  Effect on the BP  Lose extra pounds and watch your waistline Weight loss is one of the most effective lifestyle changes for controlling blood  pressure. If you're overweight or obese, losing even a small amount of weight can help reduce blood pressure. Blood pressure might go down by about 1 millimeter of mercury (mm Hg) with each kilogram (about 2.2 pounds) of weight lost.  Exercise regularly As a general goal, aim for at least 30 minutes of moderate physical activity every day. Regular physical activity can lower high blood pressure by about 5 to 8 mm Hg.  Eat a healthy diet Eating a diet rich in whole grains, fruits, vegetables, and low-fat dairy products and low in saturated fat and cholesterol. A healthy diet can lower high blood pressure by up to 11 mm Hg.  Reduce salt (sodium) in your diet Even a small reduction of sodium in the diet can improve heart health and reduce high blood pressure by about 5 to 6 mm Hg.  Limit alcohol One drink equals 12 ounces of beer, 5 ounces of wine, or 1.5 ounces of 80-proof liquor.  Limiting alcohol to less than one drink a day for women or two drinks a day for men can help lower blood pressure by about 4 mm Hg.   If you have any questions or concerns please use My Chart to send questions or call the office at [phone]

## 2024-01-21 NOTE — Progress Notes (Signed)
 Office Visit    Patient Name: Charles Villanueva Date of Encounter: 01/21/2024  Primary Care Provider:  Arloa Elsie SAUNDERS, MD Primary Cardiologist:  None  Chief Complaint    Hypertension  Significant Past Medical History   CAD 8/25 - CAC 128 (83rd percentile); 2009 cath w 60% 1st Dx  DM2 6/25 A1c 7.1 (pt reports lower now)  HLD Elevated triglycerides - will have follow with Dr. Mona           Allergies  Allergen Reactions   Gluten Meal     Stomach cramps, dizziness   Penicillins Itching    Did it involve swelling of the face/tongue/throat, SOB, or low BP? Unknown Did it involve sudden or severe rash/hives, skin peeling, or any reaction on the inside of your mouth or nose? Unknown Did you need to seek medical attention at a hospital or doctor's office? Unknown When did it last happen?   Pt cant remember he was 53yrs old    If all above answers are NO, may proceed with cephalosporin use.     History of Present Illness    Charles Villanueva is a 53 y.o. male patient of Dr Jeffrie, in the office today to discuss options for cholesterol management.  He has a family history of elevated triglycerides, and his have been as high as 1500.   He notes that his father has high trigs as well, although hers have not been nearly as high.  Patient also has questions about his blood pressure, noting that 140-160 systolic is normal, and diastolic often in the 90's.  Currently takes losartan hctz 100/25.    At his last visit with me, about 6 weeks ago, we switched losartan hctz 100/25 to olmesartan  40 mg daily.  He returns today for a blood pressure check.  Home readings indicate that diastolic readings still elevated, averaging 90.  Notably a repeat in triglyceride levels showed an increase to over 1400.  I have arranged for him to see Dr. Mona for potential new therapies.  He does note that he had all 4 children get lipid profiles, and all had triglycerides WNL  Triglyceride goal:  < 150  BP goal <  130/80  Current Medications:   olmesartan  40 mg  Family Hx: thinks father had trigs, but never > 500, both had high LDL ; both parents deceased, mother had bad heart, died from DM/CKD; father died cancer; brother thyroid issues, never mentioned chol, low sodium; 4 kids 77,24,22,19;  65 yr old had 74 trigs  Social Hx: Tobacco: no Alcohol:  no    Diet:   last meal 8:30 pm, next 10:30 am - breakfast egg -2 with avocado; sardines about once weekly; 2 pm salad, occasioal fast food if working - about once weekly; dinner chicken, beef, salmon; trying to limit rice and potatoes to once twice weekly - trying black rice ; snacking - poptart at night;    Cut out all refined carbs  Exercise: 10-15 min daily  BP readings:  home device (arm) about 55 years old.  Has 7 readings from past month, average 133/90  Accessory Clinical Findings   6/25 (in [facility])  TC 282, TG 1148, HDL 27, LDL 54  No results found for: LIPOA  Lab Results  Component Value Date   ALT 27 11/17/2018   AST 21 11/17/2018   ALKPHOS 48 11/17/2018   BILITOT 1.1 11/17/2018   Lab Results  Component Value Date   CREATININE 1.07 11/17/2018   BUN 12  11/17/2018   NA 136 11/17/2018   K 4.0 11/17/2018   CL 104 11/17/2018   CO2 21 (L) 11/17/2018   No results found for: HGBA1C  Home Medications    Current Outpatient Medications  Medication Sig Dispense Refill   amLODipine (NORVASC) 2.5 MG tablet Take 1 tablet (2.5 mg total) by mouth daily. 180 tablet 3   Accu-Chek Softclix Lancets lancets as directed finger stick as directed; Duration: 90 days     ALPRAZolam (XANAX) 0.5 MG tablet Take 0.5 mg by mouth 2 (two) times daily as needed.     aspirin  81 MG chewable tablet Chew 81 mg by mouth once.     Blood Glucose Monitoring Suppl (ACCU-CHEK GUIDE ME) w/Device KIT See admin instructions.     Cholecalciferol 125 MCG (5000 UT) TABS 1 tablet Orally Once a day     fenofibrate (TRICOR) 145 MG tablet Take 145 mg by mouth daily.      Menatetrenone (VITAMIN K2) 100 MCG TABS as directed Orally daily     olmesartan  (BENICAR ) 40 MG tablet Take 1 tablet (40 mg total) by mouth daily. 90 tablet 1   omega-3 acid ethyl esters (LOVAZA) 1 g capsule 3-4 capsules Orally once a day     rosuvastatin  (CRESTOR ) 5 MG tablet Take 1 tablet (5 mg total) by mouth daily. 90 tablet 3   sertraline (ZOLOFT) 50 MG tablet Take 50 mg by mouth daily.     No current facility-administered medications for this visit.     Assessment & Plan    Hypertension Assessment: BP is uncontrolled in office BP 128/90 mmHg;  diastolic above the goal (<130/80). Tolerates olmesartan  well, without any side effects Denies SOB, palpitation, chest pain, headaches,or swelling Reiterated the importance of regular exercise and low salt diet   Plan:  Start taking amlodipine 2.5 mg once daily Continue taking olmesartan  40 mg once daily Patient to keep record of BP readings with heart rate and report to us  at the next visit Patient to follow up with me in 3 months; Dr Mona in January  Labs ordered today:  none   Allean Mink, PharmD CPP [facility] [address]   [place], [place] [address] [phone]  01/21/2024, 9:40 AM

## 2024-01-21 NOTE — Assessment & Plan Note (Signed)
 Assessment: BP is uncontrolled in office BP 128/90 mmHg;  diastolic above the goal (<130/80). Tolerates olmesartan  well, without any side effects Denies SOB, palpitation, chest pain, headaches,or swelling Reiterated the importance of regular exercise and low salt diet   Plan:  Start taking amlodipine 2.5 mg once daily Continue taking olmesartan  40 mg once daily Patient to keep record of BP readings with heart rate and report to us  at the next visit Patient to follow up with me in 3 months; Dr Mona in January  Labs ordered today:  none

## 2024-02-12 ENCOUNTER — Encounter (HOSPITAL_BASED_OUTPATIENT_CLINIC_OR_DEPARTMENT_OTHER): Payer: Self-pay | Admitting: Internal Medicine

## 2024-02-12 ENCOUNTER — Telehealth (HOSPITAL_BASED_OUTPATIENT_CLINIC_OR_DEPARTMENT_OTHER): Payer: Self-pay | Admitting: *Deleted

## 2024-02-12 ENCOUNTER — Ambulatory Visit (HOSPITAL_BASED_OUTPATIENT_CLINIC_OR_DEPARTMENT_OTHER): Admitting: Internal Medicine

## 2024-02-12 VITALS — BP 126/70 | HR 76 | Ht 69.0 in | Wt 248.4 lb

## 2024-02-12 DIAGNOSIS — E781 Pure hyperglyceridemia: Secondary | ICD-10-CM

## 2024-02-12 DIAGNOSIS — I25810 Atherosclerosis of coronary artery bypass graft(s) without angina pectoris: Secondary | ICD-10-CM

## 2024-02-12 NOTE — Patient Instructions (Signed)
 Medication Instructions:  No changes *If you need a refill on your cardiac medications before your next appointment, please call your pharmacy*  Lab Work: Your physician recommends that you return for lab work today   Lp(a), ApoB, and direct LDL  If you have labs (blood work) drawn today and your tests are completely normal, you will receive your results only by: MyChart Message (if you have MyChart) OR A paper copy in the mail If you have any lab test that is abnormal or we need to change your treatment, we will call you to review the results.  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 ID: [id number]   Follow-Up: At [facility], you and your health needs are our priority.  As part of our continuing mission to provide you with exceptional heart care, our providers are all part of one team.  This team includes your primary Cardiologist (physician) and Advanced Practice Providers or APPs (Physician Assistants and Nurse Practitioners) who all work together to provide you with the care you need, when you need it.  Your next appointment:   6 month(s)  Provider:   Rosaline Bane, NP at [facility] or Dr. Mona at [facility]

## 2024-02-12 NOTE — Progress Notes (Signed)
 "   LIPID CLINIC CONSULT NOTE  Chief Complaint:  Manage dyslipidemia  Primary Care Physician: Charles Elsie SAUNDERS, MD  Primary Cardiologist:  None  HPI:  Charles Villanueva is a 54 y.o. male who is being seen today for the evaluation of dyslipidemia at the request of Charles Elsie SAUNDERS, MD. This is a pleasant 54 year old male kindly referred for evaluation management of dyslipidemia.  He recently saw Dr. Oneil Villanueva and was noted to have severe hypertriglyceridemia.  He has been on low-dose rosuvastatin , Lovaza and recently fenofibrate was added.  He noted that his triglycerides surprisingly went up from around 1100-1400 with a total cholesterol of 319 and HDL of 17.  He does note however he has been less physically active and diet has not been as ideal over the holidays.  He notes that several family members have high triglycerides and he thinks that this is a genetic triglyceride disorder.  I agree with that.  Also he was previously noted to have elevated coronary calcium  score of 128, 83rd percentile for age and sex matched controls.  He generally tries to watch a diet with regards to saturated fats.  We discussed today the importance of lowering saturated fats to less than 10% of his overall calories.  He also needs to maintain regular physical activity.  He denies any history of pancreatitis, but has had abdominal pain in the past.  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 His smoking use included cigarettes. He uses smokeless tobacco. He reports that he does not drink alcohol and does not use drugs.  ALLERGIES:   Allergies[1]  ROS: Pertinent items noted in HPI and remainder of comprehensive ROS otherwise negative.  HOME MEDS: Medications Ordered Prior to Encounter[2]  LABS/IMAGING: No results found for this or any previous visit (from the past 48 hours). No results found.  LIPID PANEL:    Component Value Date/Time   CHOL 319 (H) 01/06/2024 0920   TRIG 1,402 (HH) 01/06/2024 0920   HDL 17 (L) 01/06/2024 0920   CHOLHDL 18.8 (H) 01/06/2024 0920   LDLCALC Comment (A) 01/06/2024 0920    No results found for: LIPOA   WEIGHTS: Wt Readings from Last 3 Encounters:  02/12/24 248 lb 6.4 oz (112.7 kg)  12/08/23 243 lb 12.8 oz (110.6 kg)  11/04/23 230 lb (104.3 kg)    VITALS: BP 126/70   Pulse 76   Ht 5' 9 (1.753 m)   Wt 248 lb 6.4 oz (112.7 kg)   SpO2 99%   BMI 36.68 kg/m   EXAM: Deferred  EKG: Deferred  ASSESSMENT: Severe hypertriglyceridemia Coronary artery calcium  score of 128, 83rd percentile (09/2023) Family history of high triglycerides  PLAN: 1.   Charles Villanueva has severe hypertriglyceridemia.  This could possibly be MCS or FCS.  I think genetic testing would be very helpful as it may help him qualify for an APO C3 inhibitor if there is genetic evidence of familial chylomicronemia syndrome.  Will send that out today.  He understands this takes about 3 weeks to get back and  I will reach out to him with those results.  Cost may be covered by insurance but would be no more than a $299 charge.  I would continue his current therapy with fenofibrate, rosuvastatin  and Lovaza.  Will check APO B, LP(a) and direct LDL today.  If this remains above target I would advise increasing his statin.  I provided literature regarding reducing triglycerides with dietary changes.  Plan to repeat lipid test probably in about 6 months at follow-up.  Unfortunately, without a history of pancreatitis he will not qualify for current clinical research trial.  Thanks again for the kind referral.  Charles KYM Maxcy, MD, [facility], FNLA, FACP  [place]  [facility]  Medical Director of the [facility]  [facility] Diplomate of the American Board of Clinical Lipidology Attending Cardiologist  Direct Dial: [phone]  Fax: [phone]  Website:  [email or web address]  Charles Villanueva 02/12/2024, 1:40 PM     [1]  Allergies Allergen Reactions   Gluten Meal     Stomach cramps, dizziness   Penicillins Itching    Did it involve swelling of the face/tongue/throat, SOB, or low BP? Unknown Did it involve sudden or severe rash/hives, skin peeling, or any reaction on the inside of your mouth or nose? Unknown Did you need to seek medical attention at a hospital or doctor's office? Unknown When did it last happen?   Pt cant remember he was 54yrs old    If all above answers are NO, may proceed with cephalosporin use.   [2]  Current Outpatient Medications on File Prior to Visit  Medication Sig Dispense Refill   Accu-Chek Softclix Lancets lancets as directed finger stick as directed; Duration: 90 days     ALPRAZolam (XANAX) 0.5 MG tablet Take 0.5 mg by mouth 2 (two) times daily as needed.     amLODipine (NORVASC) 2.5 MG tablet Take 1 tablet (2.5 mg total) by mouth daily. 180 tablet 3   aspirin  81 MG chewable tablet Chew 81 mg by mouth once.     Blood Glucose Monitoring Suppl (ACCU-CHEK GUIDE ME) w/Device KIT See admin instructions.     Cholecalciferol 125 MCG (5000 UT) TABS 1 tablet Orally Once a day     fenofibrate (TRICOR) 145 MG tablet Take 145 mg by mouth daily.     Menatetrenone (VITAMIN K2) 100 MCG TABS as directed Orally daily     olmesartan  (BENICAR ) 40 MG tablet Take 1 tablet (40 mg total) by mouth daily. 90 tablet 1   omega-3 acid ethyl esters (LOVAZA) 1 g capsule 3-4 capsules Orally once a day     sertraline (ZOLOFT) 50 MG tablet Take 50 mg by mouth daily.     rosuvastatin  (CRESTOR ) 5 MG tablet Take 1 tablet (5 mg total) by mouth daily.  (Patient not taking: Reported on 02/12/2024) 90 tablet 3   No current facility-administered medications on file prior to visit.   "

## 2024-02-12 NOTE — Telephone Encounter (Signed)
 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 ID: [id number]

## 2024-02-12 NOTE — Addendum Note (Signed)
 Addended by: Moraima Burd on: 02/12/2024 02:38 PM   Modules accepted: Orders

## 2024-02-15 LAB — LDL CHOLESTEROL, DIRECT: LDL Direct: 106 mg/dL — ABNORMAL HIGH (ref 0–99)

## 2024-02-15 LAB — LIPOPROTEIN A (LPA)

## 2024-02-15 LAB — APOLIPOPROTEIN B: Apolipoprotein B: 141 mg/dL — ABNORMAL HIGH

## 2024-02-16 ENCOUNTER — Ambulatory Visit: Payer: Self-pay | Admitting: Internal Medicine

## 2024-02-16 DIAGNOSIS — E782 Mixed hyperlipidemia: Secondary | ICD-10-CM

## 2024-02-16 DIAGNOSIS — E781 Pure hyperglyceridemia: Secondary | ICD-10-CM

## 2024-03-11 MED ORDER — ROSUVASTATIN CALCIUM 20 MG PO TABS: 20.0000 mg | ORAL_TABLET | Freq: Every day | ORAL | 3 refills | Status: AC

## 2024-03-11 NOTE — Addendum Note (Signed)
 Addended by: LORING ANDRIETTE HERO on: 03/11/2024 03:59 PM   Modules accepted: Orders
